# Patient Record
Sex: Male | Born: 1970 | Race: White | Hispanic: No | Marital: Single | Smoking: Former smoker
Health system: Southern US, Community
[De-identification: ages and names within clinical notes are randomized; demographics above are authoritative.]

## PROBLEM LIST (undated history)

## (undated) DIAGNOSIS — M109 Gout, unspecified: Secondary | ICD-10-CM

## (undated) DIAGNOSIS — A6002 Herpesviral infection of other male genital organs: Secondary | ICD-10-CM

## (undated) DIAGNOSIS — K219 Gastro-esophageal reflux disease without esophagitis: Secondary | ICD-10-CM

## (undated) HISTORY — PX: KNEE SURGERY: SHX244

## (undated) HISTORY — DX: Herpesviral infection of other male genital organs: A60.02

---

## 2000-12-06 ENCOUNTER — Ambulatory Visit (HOSPITAL_COMMUNITY): Admission: RE | Admit: 2000-12-06 | Discharge: 2000-12-06 | Payer: Self-pay | Admitting: Orthopedic Surgery

## 2000-12-06 ENCOUNTER — Encounter: Payer: Self-pay | Admitting: Orthopedic Surgery

## 2001-04-12 ENCOUNTER — Encounter: Payer: Self-pay | Admitting: Emergency Medicine

## 2001-04-12 ENCOUNTER — Emergency Department (HOSPITAL_COMMUNITY): Admission: EM | Admit: 2001-04-12 | Discharge: 2001-04-12 | Payer: Self-pay | Admitting: Emergency Medicine

## 2003-07-11 ENCOUNTER — Encounter: Payer: Self-pay | Admitting: Specialist

## 2003-07-11 ENCOUNTER — Ambulatory Visit (HOSPITAL_COMMUNITY): Admission: RE | Admit: 2003-07-11 | Discharge: 2003-07-11 | Payer: Self-pay | Admitting: Specialist

## 2012-03-19 ENCOUNTER — Emergency Department
Admission: EM | Admit: 2012-03-19 | Discharge: 2012-03-19 | Disposition: A | Discharge status: Home / Self Care | Payer: Self-pay | Source: Home / Self Care | Attending: Emergency Medicine | Admitting: Emergency Medicine

## 2012-03-19 ENCOUNTER — Telehealth: Payer: Self-pay | Admitting: *Deleted

## 2012-03-19 DIAGNOSIS — K649 Unspecified hemorrhoids: Secondary | ICD-10-CM

## 2012-03-19 HISTORY — DX: Gout, unspecified: M10.9

## 2012-03-19 MED ORDER — HYDROCORTISONE 2.5 % RE CREA: TOPICAL_CREAM | RECTAL | Status: AC

## 2012-03-19 MED ORDER — DOCUSATE SODIUM 100 MG PO CAPS: 100.0000 mg | ORAL_CAPSULE | Freq: Two times a day (BID) | ORAL | Status: AC

## 2012-03-19 NOTE — ED Provider Notes (Signed)
History     CSN: [id number]  Arrival date & time 03/19/12  1414   First MD Initiated Contact with Patient 03/19/12 1443      Chief Complaint  Patient presents with  . Hemorrhoids    2 days    (Consider location/radiation/quality/duration/timing/severity/associated sxs/prior treatment) HPI This patient comes today complaining of hemorrhoids.  He has a history of hemorrhoids ever since he was 41 years old.  He gets some about once a week or once a month.  He is briefly spoken to his PCP in the past but has never done anything about it.  He has used Preparation H which has helped in the past.  They have occasionally bled and are frequently painful as well.  He is coming today to see what he should do.  He has one that has flared up on him and it hurts when he goes to the bathroom or rides his motorcycle or sits in a chair.  Past Medical History  Diagnosis Date  . Gout     History reviewed. No pertinent past surgical history.  Family History  Problem Relation Age of Onset  . Heart failure Father     History  Substance Use Topics  . Smoking status: Current Some Day Smoker -- 20 years  . Smokeless tobacco: Never Used  . Alcohol Use: 10.0 oz/week    20 drink(s) per week      Review of Systems  All other systems reviewed and are negative.    Allergies  Review of patient's allergies indicates no known allergies.  Home Medications   Current Outpatient Rx  Name Route Sig Dispense Refill  . TRAMADOL HCL 50 MG PO TABS Oral Take 50 mg by mouth every 6 (six) hours as needed.    Marland Kitchen DOCUSATE SODIUM 100 MG PO CAPS Oral Take 1 capsule (100 mg total) by mouth every 12 (twelve) hours. 20 capsule 0  . HYDROCORTISONE 2.5 % RE CREA  Apply rectally 2 times daily 30 g 1    BP 131/83  Pulse 74  Temp 97.9 F (36.6 C) (Oral)  Resp 16  Ht 6' (1.829 m)  Wt 224 lb (101.606 kg)  BMI 30.38 kg/m2  SpO2 99%  Physical Exam  Nursing note and vitals reviewed. Constitutional: He is  oriented to person, place, and time. He appears well-developed and well-nourished.  HENT:  Head: Normocephalic and atraumatic.  Eyes: No scleral icterus.  Neck: Neck supple.  Cardiovascular: Regular rhythm and normal heart sounds.   Pulmonary/Chest: Effort normal and breath sounds normal. No respiratory distress.  Genitourinary:       Rectal examination demonstrates a external hemorrhoid approximately 2 cm in diameter.  It is tender to palpation.  It does not appear to be currently thrombosed.  No active bleeding.  No signs of infection or abscess.  Neurological: He is alert and oriented to person, place, and time.  Skin: Skin is warm and dry.  Psychiatric: He has a normal mood and affect. His speech is normal.    ED Course  Procedures (including critical care time)  Labs Reviewed - No data to display No results found.   1. Hemorrhoid       MDM  This patient is having recurrent hemorrhoids.  I gave him a prescription for docusate as well as Anusol HC cream to treat his current symptoms.  However because of his recurrent issues, I would like him to see a general surgeon and he agrees with that since he would  like to have it taken care of once and for all.  We will set him up with a general surgeon today in clinic.  A if worsening pain, he'll need to go the emergency room.  Avoid straining or sitting on toilet too long, increase hydration.  Marlaine Hind, MD 03/19/12 [phone]

## 2012-03-19 NOTE — ED Notes (Signed)
Roberto Porter complains of a hemorrhoid for 2 days. He states he has a history of hemorrhoids but the size of this one is larger than any he has had in the past.

## 2013-12-30 ENCOUNTER — Encounter: Payer: Self-pay | Admitting: Emergency Medicine

## 2013-12-30 ENCOUNTER — Emergency Department
Admission: EM | Admit: 2013-12-30 | Discharge: 2013-12-30 | Disposition: A | Discharge status: Home / Self Care | Payer: Managed Care, Other (non HMO) | Source: Home / Self Care | Attending: Family Medicine | Admitting: Family Medicine

## 2013-12-30 DIAGNOSIS — R079 Chest pain, unspecified: Secondary | ICD-10-CM

## 2013-12-30 DIAGNOSIS — R1013 Epigastric pain: Secondary | ICD-10-CM

## 2013-12-30 LAB — TROPONIN I: TROPONIN I: 0.01 ng/mL (ref <0.06)

## 2013-12-30 LAB — POCT CBC W AUTO DIFF (K'VILLE URGENT CARE)

## 2013-12-30 LAB — COMPLETE METABOLIC PANEL WITH GFR
ALT: 17 U/L (ref 0–53)
AST: 13 U/L (ref 0–37)
Albumin: 4.6 g/dL (ref 3.5–5.2)
Alkaline Phosphatase: 51 U/L (ref 39–117)
BILIRUBIN TOTAL: 1.1 mg/dL (ref 0.2–1.2)
BUN: 14 mg/dL (ref 6–23)
CO2: 24 mEq/L (ref 19–32)
Calcium: 11.7 mg/dL — ABNORMAL HIGH (ref 8.4–10.5)
Chloride: 98 mEq/L (ref 96–112)
Creat: 1.12 mg/dL (ref 0.50–1.35)
GFR, EST NON AFRICAN AMERICAN: 79 mL/min
GFR, Est African American: >89 mL/min
GLUCOSE: 110 mg/dL — AB (ref 70–99)
Potassium: 4.2 mEq/L (ref 3.5–5.3)
Sodium: 137 mEq/L (ref 135–145)
Total Protein: 7.4 g/dL (ref 6.0–8.3)

## 2013-12-30 LAB — LIPASE: LIPASE: 31 U/L (ref 0–75)

## 2013-12-30 LAB — AMYLASE: Amylase: 24 U/L (ref 0–105)

## 2013-12-30 MED ORDER — OMEPRAZOLE 40 MG PO CPDR: 40.0000 mg | DELAYED_RELEASE_CAPSULE | Freq: Every day | ORAL | Status: DC

## 2013-12-30 NOTE — ED Notes (Signed)
Nausea and abdominal pain since Sat. Stomach cramps can't get comfortable, unable to eat had chicken broth yesterday, sippig ginger ale and water only since 1pm on Sat. Vomited once.

## 2013-12-30 NOTE — Discharge Instructions (Signed)
May take Tums for 2 to 3 more days as needed. If symptoms become significantly worse during the night or over the weekend, proceed to the local emergency room.

## 2013-12-30 NOTE — ED Provider Notes (Signed)
CSN: [id number]     Arrival date & time 12/30/13  1029 History   First MD Initiated Contact with Patient 12/30/13 1051     Chief Complaint  Patient presents with  . Nausea      HPI Comments: Two days ago at about noon while at work patient developed a sensation of abdominal bloating, followed by intermittent "squeezing" mid-abdominal pain that radiates occasionally to his lower back.  Yesterday the discomfort began to last about two seconds, recurring every several seconds.  He states that belching helps.  The pain decreases for a while when he takes Tums.  No shortness of breath or sweating.  No palpitations.  No nausea/vomiting or change in bowel movements.  Last night he had sweats.  He has had decreased appetite for two days.  He admits that he drinks about 2 cases of beer per week.  He also smokes.  He admits that for about 10 years he occasionally has a sensation of food becoming "stuck" in his lower esophagus, relieved by liquids. Family history includes a fib in his father  Patient is a 43 y.o. male presenting with abdominal pain. The history is provided by the patient.  Abdominal Pain This is a new problem. The current episode started 2 days ago. The problem occurs constantly. The problem has not changed since onset.Associated symptoms include abdominal pain. Pertinent negatives include no chest pain and no shortness of breath. Nothing aggravates the symptoms. Relieved by: Tums. Treatments tried: Tums. The treatment provided moderate relief.    Past Medical History  Diagnosis Date  . Gout    History reviewed. No pertinent past surgical history. Family History  Problem Relation Age of Onset  . Heart failure Father    History  Substance Use Topics  . Smoking status: Current Some Day Smoker -- 25 years  . Smokeless tobacco: Never Used  . Alcohol Use: 10.0 oz/week    20 drink(s) per week    Review of Systems  Respiratory: Negative for shortness of breath.   Cardiovascular:  Negative for chest pain.  Gastrointestinal: Positive for abdominal pain.  All other systems reviewed and are negative.    Allergies  Review of patient's allergies indicates not on file.  Home Medications   Current Outpatient Rx  Name  Route  Sig  Dispense  Refill  . omeprazole (PRILOSEC) 40 MG capsule   Oral   Take 1 capsule (40 mg total) by mouth daily. Take 30 minutes before a meal.   30 capsule   1    BP 124/82  Pulse 58  Temp(Src) 97.6 F (36.4 C) (Oral)  Ht 6' (1.829 m)  Wt 206 lb (93.441 kg)  BMI 27.93 kg/m2  SpO2 98% Physical Exam Nursing notes and Vital Signs reviewed. Appearance:  Patient appears healthy, stated age, and in no acute distress Eyes:  Pupils are equal, round, and reactive to light and accomodation.  Extraocular movement is intact.  Conjunctivae are not inflamed  Ears:  Canals normal.  Tympanic membranes normal.  Nose:  Normal turbinates.  No sinus tenderness.   Pharynx:  Normal Neck:  Supple.  No adenopathy  Lungs:  Clear to auscultation.  Breath sounds are equal.  Heart:  Regular rate and rhythm without murmurs, rubs, or gallops.  Abdomen:  Minimal sub-xiphoid tenderness without masses or hepatosplenomegaly.  Bowel sounds are present.  No CVA or flank tenderness.  Extremities:  No edema.  No calf tenderness Skin:  No rash present.   ED Course  Procedures none    Labs Reviewed  COMPLETE METABOLIC PANEL WITH GFR  AMYLASE  LIPASE  TROPONIN I  TROPONIN T  POCT CBC W AUTO DIFF ([facility]):  WBC 8.4; LY 25.3; MO 7.4; GR 67.3; Hgb 16.5; Platelets 303   POCT URINALYSIS DIPSTICK (patient unable to obtain specimen)   EKG:  Rate 59 PR:  0.176 QT:  0.366 QRS:  0.94 Axis:  46 degrees Interpretation:  Sinus bradycardia; within normal limits     MDM   1. Chest pain:  Normal EKG reassuring  2. Abdominal pain, epigastric, suspect GI source: ?GERD, ?ulcer.  Normal white blood count reassuring   Pending:  CMP, amylase, lipase, Troponin  I, Troponin T Begin Prilosec. May take Tums for 2 to 3 more days as needed. If symptoms become significantly worse during the night or over the weekend, proceed to the local emergency room. Followup with GI in approximately one week.    Lattie HawStephen A Beese, MD 12/31/13 [phone]

## 2014-01-01 ENCOUNTER — Telehealth: Payer: Self-pay | Admitting: Emergency Medicine

## 2014-01-02 LAB — TROPONIN T: Troponin T TROPT: <0.01 ng/mL (ref <0.01)

## 2014-02-18 ENCOUNTER — Emergency Department
Admission: EM | Admit: 2014-02-18 | Discharge: 2014-02-18 | Disposition: A | Discharge status: Home / Self Care | Payer: Managed Care, Other (non HMO) | Source: Home / Self Care

## 2014-02-18 ENCOUNTER — Ambulatory Visit (INDEPENDENT_AMBULATORY_CARE_PROVIDER_SITE_OTHER): Payer: Managed Care, Other (non HMO) | Admitting: Sports Medicine

## 2014-02-18 ENCOUNTER — Emergency Department (INDEPENDENT_AMBULATORY_CARE_PROVIDER_SITE_OTHER): Payer: Managed Care, Other (non HMO)

## 2014-02-18 ENCOUNTER — Encounter: Payer: Self-pay | Admitting: Emergency Medicine

## 2014-02-18 DIAGNOSIS — IMO0002 Reserved for concepts with insufficient information to code with codable children: Secondary | ICD-10-CM

## 2014-02-18 DIAGNOSIS — S62610A Displaced fracture of proximal phalanx of right index finger, initial encounter for closed fracture: Secondary | ICD-10-CM

## 2014-02-18 HISTORY — DX: Gastro-esophageal reflux disease without esophagitis: K21.9

## 2014-02-18 MED ORDER — HYDROCODONE-ACETAMINOPHEN 5-325 MG PO TABS: 1.0000 | ORAL_TABLET | Freq: Three times a day (TID) | ORAL | Status: DC | PRN
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
## 2014-02-18 NOTE — Assessment & Plan Note (Signed)
Reduction of fracture in the office today. Radial gutter splint placed.  I do expect an additional 5-6 weeks for healing.  hydrocodone for pain. 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  Return in 2 weeks, x-ray before visit.  I billed a fracture code for this visit, all subsequent visits for this complaint will be "post-op checks" in the global period.
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
## 2014-02-19 NOTE — ED Provider Notes (Signed)
Agree with exam, assessment, and plan.   Lattie Haw, MD 02/19/14 1259

## 2014-03-03 ENCOUNTER — Encounter: Payer: Self-pay | Admitting: Sports Medicine

## 2014-03-03 ENCOUNTER — Emergency Department
Admission: EM | Admit: 2014-03-03 | Discharge: 2014-03-03 | Discharge status: Absent without leave | Payer: Managed Care, Other (non HMO) | Source: Home / Self Care

## 2014-03-03 ENCOUNTER — Ambulatory Visit (INDEPENDENT_AMBULATORY_CARE_PROVIDER_SITE_OTHER): Payer: Managed Care, Other (non HMO) | Admitting: Sports Medicine

## 2014-03-03 ENCOUNTER — Ambulatory Visit (INDEPENDENT_AMBULATORY_CARE_PROVIDER_SITE_OTHER): Payer: Managed Care, Other (non HMO)

## 2014-03-03 VITALS — BP 121/82 | HR 57 | Ht 72.0 in | Wt 218.0 lb

## 2014-03-03 DIAGNOSIS — IMO0002 Reserved for concepts with insufficient information to code with codable children: Secondary | ICD-10-CM

## 2014-03-03 DIAGNOSIS — S62610A Displaced fracture of proximal phalanx of right index finger, initial encounter for closed fracture: Secondary | ICD-10-CM

## 2014-03-03 DIAGNOSIS — IMO0001 Reserved for inherently not codable concepts without codable children: Secondary | ICD-10-CM

## 2014-03-03 NOTE — Assessment & Plan Note (Signed)
Overall doing well. I have placed an extension brace on his finger. He has questionable compliance with the splint I placed at the last visit. Unfortunately I am worried that he is developing early boutonniere deformity, I have braced the proximal interphalangeal joint and extension. Return to see me in 2 weeks.

## 2014-03-03 NOTE — Progress Notes (Signed)
  Subjective: 2 weeks postfracture at the base of the right index finger proximal phalanx, pain is doing much better, he is having some difficulty extending the proximal interphalangeal joint. He has had questionable compliance with his radial gutter splint.   Objective: General: Well-developed, well-nourished, and in no acute distress. Right hand: Still minimally tender to palpation over the fracture site, he does have what appears to be an early boutonniere's deformity at the proximal interphalangeal joint, he does have some strength to extension at the PIP, and good strength to extension of the DIP joint and there does not appear to be any rotational deformity.  Alignment remains anatomic, there is some bony callus formation.  I applied a dorsal extension splint keeping the proximal interphalangeal joint in full extension.  Assessment/plan:

## 2014-03-17 ENCOUNTER — Encounter: Payer: Self-pay | Admitting: Sports Medicine

## 2014-03-17 ENCOUNTER — Ambulatory Visit (INDEPENDENT_AMBULATORY_CARE_PROVIDER_SITE_OTHER): Payer: Managed Care, Other (non HMO)

## 2014-03-17 ENCOUNTER — Ambulatory Visit (INDEPENDENT_AMBULATORY_CARE_PROVIDER_SITE_OTHER): Payer: Managed Care, Other (non HMO) | Admitting: Sports Medicine

## 2014-03-17 VITALS — BP 128/74 | HR 63 | Ht 72.0 in | Wt 217.0 lb

## 2014-03-17 DIAGNOSIS — S5290XD Unspecified fracture of unspecified forearm, subsequent encounter for closed fracture with routine healing: Secondary | ICD-10-CM

## 2014-03-17 DIAGNOSIS — S62610A Displaced fracture of proximal phalanx of right index finger, initial encounter for closed fracture: Secondary | ICD-10-CM

## 2014-03-17 DIAGNOSIS — IMO0001 Reserved for inherently not codable concepts without codable children: Secondary | ICD-10-CM

## 2014-03-17 DIAGNOSIS — S62610D Displaced fracture of proximal phalanx of right index finger, subsequent encounter for fracture with routine healing: Secondary | ICD-10-CM

## 2014-03-17 MED ORDER — HYDROCODONE-ACETAMINOPHEN 5-325 MG PO TABS: 1.0000 | ORAL_TABLET | Freq: Three times a day (TID) | ORAL | Status: DC | PRN

## 2014-03-17 NOTE — Progress Notes (Signed)
  Subjective: Now approximately 5 weeks post fracture at the base of the proximal phalanx of the right index finger. Pain continues to improve, still with difficulty extending the finger fully.  Objective: General: Well-developed, well-nourished, and in no acute distress. Right hand: Only minimal tenderness to palpation of the fracture site and excellent range of motion at the second metacarpophalangeal joint. He still does have some flexion lag at the proximal interphalangeal joint of approximately 5, and very little strength to extension at the distal interphalangeal joint. Collaterals are stable.  Assessment/plan:

## 2014-03-17 NOTE — Assessment & Plan Note (Addendum)
Minimal compliance with immobilization, needs to go back to work. Again, I have voiced my concern that there is an early boutonniere's deformity, possibly also with mallet finger. Stack splint placed. Advised him to wear this as often as possible. Return in a month, if no better, and if we don't have good strength to extension then we will consider MRI and possible surgical intervention. I have also advised using a rubber band for finger extension exercises.
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
## 2017-04-06 DIAGNOSIS — G8929 Other chronic pain: Secondary | ICD-10-CM | POA: Insufficient documentation

## 2017-04-06 DIAGNOSIS — M1711 Unilateral primary osteoarthritis, right knee: Secondary | ICD-10-CM | POA: Insufficient documentation

## 2020-09-26 DIAGNOSIS — F1020 Alcohol dependence, uncomplicated: Secondary | ICD-10-CM | POA: Insufficient documentation

## 2023-10-02 ENCOUNTER — Encounter: Payer: Self-pay | Admitting: Family Medicine

## 2023-10-02 ENCOUNTER — Ambulatory Visit (INDEPENDENT_AMBULATORY_CARE_PROVIDER_SITE_OTHER): Payer: No Typology Code available for payment source | Admitting: Family Medicine

## 2023-10-02 VITALS — BP 130/82 | HR 61 | Ht 70.47 in | Wt 230.4 lb

## 2023-10-02 DIAGNOSIS — Z13220 Encounter for screening for lipoid disorders: Secondary | ICD-10-CM | POA: Diagnosis not present

## 2023-10-02 DIAGNOSIS — M25561 Pain in right knee: Secondary | ICD-10-CM

## 2023-10-02 DIAGNOSIS — E790 Hyperuricemia without signs of inflammatory arthritis and tophaceous disease: Secondary | ICD-10-CM

## 2023-10-02 DIAGNOSIS — E291 Testicular hypofunction: Secondary | ICD-10-CM | POA: Insufficient documentation

## 2023-10-02 DIAGNOSIS — F1020 Alcohol dependence, uncomplicated: Secondary | ICD-10-CM

## 2023-10-02 DIAGNOSIS — Z125 Encounter for screening for malignant neoplasm of prostate: Secondary | ICD-10-CM | POA: Diagnosis not present

## 2023-10-02 DIAGNOSIS — G8929 Other chronic pain: Secondary | ICD-10-CM

## 2023-10-02 NOTE — Progress Notes (Signed)
 Roberto Porter - 53 y.o. male MRN [id number]  Date of birth: 14-Sep-1971  Subjective Chief Complaint  Patient presents with   Establish Care   Anxiety   Hypogonadism    HPI Roberto Porter is a 53 y.o. male here today for initial visit to establish care.    He does report some increased stress related to his mother being in poor health as well as stress related to work.  He has history of hypogonadism.  He was doing injectable testosterone  previously and would like to restart this.  He felt pretty good using this and did not notice any adverse effects with this.   History of OA of the knee.  History of gout and previously taking allopurinol.  Seen by orthopedics previously.  He has had intra-articular injection previously with temporary improvement.  ROS:  A comprehensive ROS was completed and negative except as noted per HPI  No Known Allergies  Past Medical History:  Diagnosis Date   GERD (gastroesophageal reflux disease)    Gout    Herpes genitalis in men     Past Surgical History:  Procedure Laterality Date   KNEE SURGERY Right    ACL, meniscus, and replacement    Social History   Socioeconomic History   Marital status: Single    Spouse name: Not on file   Number of children: Not on file   Years of education: Not on file   Highest education level: Not on file  Occupational History   Occupation: Maintenance Tech  Tobacco Use   Smoking status: Former    Types: Cigarettes   Smokeless tobacco: Never  Vaping Use   Vaping status: Never Used  Substance and Sexual Activity   Alcohol use: Yes    Alcohol/week: 14.0 standard drinks of alcohol    Types: 14 Standard drinks or equivalent per week    Comment: 24 q wk   Drug use: No   Sexual activity: Yes  Other Topics Concern   Not on file  Social History Narrative   Not on file   Social Drivers of Health   Financial Resource Strain: Low Risk  (09/27/2020)   Received from [facility]   Overall Financial Resource  Strain (CARDIA)    Difficulty of Paying Living Expenses: Not hard at all  Food Insecurity: Not on file  Transportation Needs: Not on file  Physical Activity: Not on file  Stress: No Stress Concern Present (09/27/2020)   Received from [facility] - Occupational Stress Questionnaire    Feeling of Stress : Not at all  Social Connections: Unknown (02/06/2022)   [facility]   Social Network    Social Network: Not on file    Family History  Problem Relation Age of Onset   Heart failure Father     Health Maintenance  Topic Date Due   HIV Screening  Never done   Hepatitis C Screening  Never done   DTaP/Tdap/Td (1 - Tdap) Never done   Colonoscopy  Never done   Zoster Vaccines- Shingrix (1 of 2) Never done   INFLUENZA VACCINE  Never done   COVID-19 Vaccine (1 - 2024-25 season) Never done   HPV VACCINES  Aged Out     ----------------------------------------------------------------------------------------------------------------------------------------------------------------------------------------------------------------- Physical Exam BP 130/82 (BP Location: Left Arm, Patient Position: Sitting, Cuff Size: Large)   Pulse 61   Ht 5' 10.47 (1.79 m)   Wt 230 lb 6.4 oz (104.5 kg)  SpO2 96%   BMI 32.62 kg/m   Physical Exam Constitutional:      Appearance: Normal appearance.  HENT:     Head: Normocephalic and atraumatic.  Eyes:     General: No scleral icterus. Cardiovascular:     Rate and Rhythm: Normal rate and regular rhythm.  Pulmonary:     Effort: Pulmonary effort is normal.     Breath sounds: Normal breath sounds.  Musculoskeletal:     Cervical back: Neck supple.  Neurological:     Mental Status: He is alert.  Psychiatric:        Mood and Affect: Mood normal.        Behavior: Behavior normal.      ------------------------------------------------------------------------------------------------------------------------------------------------------------------------------------------------------------------- Assessment and Plan  EtOH dependence (HCC) We discussed cutting back on his alcohol consumption.  Chronic pain of right knee History of osteoarthritis.  Will plan to schedule with Dr. Curtis to discuss gel injections.  Hypogonadism in male Updating labs today including testosterone , FSH/LH and prolactin levels.   No orders of the defined types were placed in this encounter.   No follow-ups on file.    This visit occurred during the SARS-CoV-2 public health emergency.  Safety protocols were in place, including screening questions prior to the visit, additional usage of staff PPE, and extensive cleaning of exam room while observing appropriate contact time as indicated for disinfecting solutions.

## 2023-10-02 NOTE — Assessment & Plan Note (Signed)
 History of osteoarthritis.  Will plan to schedule with Dr. Benjamin Stain to discuss gel injections.

## 2023-10-02 NOTE — Assessment & Plan Note (Signed)
 We discussed cutting back on his alcohol consumption.

## 2023-10-02 NOTE — Assessment & Plan Note (Signed)
 Updating labs today including testosterone, FSH/LH and prolactin levels.

## 2023-10-03 ENCOUNTER — Other Ambulatory Visit (INDEPENDENT_AMBULATORY_CARE_PROVIDER_SITE_OTHER): Payer: No Typology Code available for payment source

## 2023-10-03 ENCOUNTER — Ambulatory Visit: Payer: No Typology Code available for payment source

## 2023-10-03 ENCOUNTER — Encounter: Payer: Self-pay | Admitting: Sports Medicine

## 2023-10-03 ENCOUNTER — Ambulatory Visit (INDEPENDENT_AMBULATORY_CARE_PROVIDER_SITE_OTHER): Payer: No Typology Code available for payment source | Admitting: Sports Medicine

## 2023-10-03 DIAGNOSIS — M25561 Pain in right knee: Secondary | ICD-10-CM | POA: Diagnosis not present

## 2023-10-03 DIAGNOSIS — G8929 Other chronic pain: Secondary | ICD-10-CM

## 2023-10-03 DIAGNOSIS — Z0189 Encounter for other specified special examinations: Secondary | ICD-10-CM

## 2023-10-03 LAB — CBC WITH DIFFERENTIAL/PLATELET
Basophils Absolute: 0.1 10*3/uL (ref 0.0–0.2)
Basos: 1 %
EOS (ABSOLUTE): 0.3 10*3/uL (ref 0.0–0.4)
Eos: 6 %
Hematocrit: 43.5 % (ref 37.5–51.0)
Hemoglobin: 15.2 g/dL (ref 13.0–17.7)
Immature Grans (Abs): 0 10*3/uL (ref 0.0–0.1)
Immature Granulocytes: 0 %
Lymphocytes Absolute: 1.9 10*3/uL (ref 0.7–3.1)
Lymphs: 32 %
MCH: 33.2 pg — ABNORMAL HIGH (ref 26.6–33.0)
MCHC: 34.9 g/dL (ref 31.5–35.7)
MCV: 95 fL (ref 79–97)
Monocytes Absolute: 0.5 10*3/uL (ref 0.1–0.9)
Monocytes: 8 %
Neutrophils Absolute: 3.2 10*3/uL (ref 1.4–7.0)
Neutrophils: 53 %
Platelets: 274 10*3/uL (ref 150–450)
RBC: 4.58 x10E6/uL (ref 4.14–5.80)
RDW: 12.5 % (ref 11.6–15.4)
WBC: 6 10*3/uL (ref 3.4–10.8)

## 2023-10-03 LAB — FSH/LH
FSH: 16.7 m[IU]/mL — ABNORMAL HIGH (ref 1.5–12.4)
LH: 9.5 m[IU]/mL — ABNORMAL HIGH (ref 1.7–8.6)

## 2023-10-03 LAB — CMP14+EGFR
ALT: 24 [IU]/L (ref 0–44)
AST: 23 [IU]/L (ref 0–40)
Albumin: 4.4 g/dL (ref 3.8–4.9)
Alkaline Phosphatase: 62 [IU]/L (ref 44–121)
BUN/Creatinine Ratio: 15 (ref 9–20)
BUN: 16 mg/dL (ref 6–24)
Bilirubin Total: 0.5 mg/dL (ref 0.0–1.2)
CO2: 21 mmol/L (ref 20–29)
Calcium: 10 mg/dL (ref 8.7–10.2)
Chloride: 102 mmol/L (ref 96–106)
Creatinine, Ser: 1.09 mg/dL (ref 0.76–1.27)
Globulin, Total: 2.4 g/dL (ref 1.5–4.5)
Glucose: 96 mg/dL (ref 70–99)
Potassium: 4.4 mmol/L (ref 3.5–5.2)
Sodium: 138 mmol/L (ref 134–144)
Total Protein: 6.8 g/dL (ref 6.0–8.5)
eGFR: 81 mL/min/{1.73_m2} (ref >59)

## 2023-10-03 LAB — LIPID PANEL WITH LDL/HDL RATIO
Cholesterol, Total: 244 mg/dL — ABNORMAL HIGH (ref 100–199)
HDL: 53 mg/dL (ref >39)
LDL Chol Calc (NIH): 152 mg/dL — ABNORMAL HIGH (ref 0–99)
LDL/HDL Ratio: 2.9 {ratio} (ref 0.0–3.6)
Triglycerides: 213 mg/dL — ABNORMAL HIGH (ref 0–149)
VLDL Cholesterol Cal: 39 mg/dL (ref 5–40)

## 2023-10-03 LAB — PSA: Prostate Specific Ag, Serum: 0.7 ng/mL (ref 0.0–4.0)

## 2023-10-03 LAB — PROLACTIN: Prolactin: 10 ng/mL (ref 3.6–25.2)

## 2023-10-03 LAB — URIC ACID: Uric Acid: 7.4 mg/dL (ref 3.8–8.4)

## 2023-10-03 LAB — TESTOSTERONE: Testosterone: 156 ng/dL — ABNORMAL LOW (ref 264–916)

## 2023-10-03 MED ORDER — TESTOSTERONE CYPIONATE 200 MG/ML IM SOLN: 200.0000 mg | INTRAMUSCULAR | 1 refills | Status: DC

## 2023-10-03 NOTE — Assessment & Plan Note (Signed)
 Pleasant 53 year old male, history of ACL reconstruction, known osteoarthritis. Increasing pain and swelling right knee, Voltaren not effective. We discussed the treatment options including injection of steroid, PRP, Visco. We will do an aspiration and injection today, return to see me in 6 weeks.

## 2023-10-03 NOTE — Progress Notes (Signed)
    Procedures performed today:    Procedure: Real-time Ultrasound Guided aspiration/injection of right knee Device: Samsung HS60  Verbal informed consent obtained.  Time-out conducted.  Noted no overlying erythema, induration, or other signs of local infection.  Skin prepped in a sterile fashion.  Local anesthesia: Topical Ethyl chloride.  With sterile technique and under real time ultrasound guidance: Aspirated 30 mL of clear, straw-colored fluid, syringe switched and 1 cc Kenalog 40, 2 cc lidocaine, 2 cc bupivacaine injected easily Completed without difficulty  Advised to call if fevers/chills, erythema, induration, drainage, or persistent bleeding.  Images permanently stored and available for review in PACS.  Impression: Technically successful ultrasound guided aspiration/injection.  Independent interpretation of notes and tests performed by another provider:   None.  Brief History, Exam, Impression, and Recommendations:    Chronic pain of right knee Pleasant 53 year old male, history of ACL reconstruction, known osteoarthritis. Increasing pain and swelling right knee, Voltaren not effective. We discussed the treatment options including injection of steroid, PRP, Visco. We will do an aspiration and injection today, return to see me in 6 weeks.  I spent 40 minutes of total time managing this patient today, this includes chart review, face to face, and non-face to face time.  This was separate from the time spent performing the procedure.  ____________________________________________ Debby PARAS. Curtis, M.D., ABFM., CAQSM., AME. Primary Care and Sports Medicine [place] [facility]  Adjunct Professor of [facility] Medicine  [employer]  Restaurant Manager, [employer]

## 2023-11-14 ENCOUNTER — Ambulatory Visit (INDEPENDENT_AMBULATORY_CARE_PROVIDER_SITE_OTHER): Payer: No Typology Code available for payment source | Admitting: Sports Medicine

## 2023-11-14 ENCOUNTER — Telehealth: Payer: Self-pay | Admitting: Sports Medicine

## 2023-11-14 ENCOUNTER — Encounter: Payer: Self-pay | Admitting: Sports Medicine

## 2023-11-14 DIAGNOSIS — M1711 Unilateral primary osteoarthritis, right knee: Secondary | ICD-10-CM

## 2023-11-14 NOTE — Telephone Encounter (Signed)
Right knee x-ray confirmed osteoarthritis, failed over 6 weeks of therapy, steroid injections, medication, needs Orthovisc or any other Visco supplement approval please.

## 2023-11-14 NOTE — Progress Notes (Signed)
    Procedures performed today:    None.  Independent interpretation of notes and tests performed by another provider:   None.  Brief History, Exam, Impression, and Recommendations:    Primary osteoarthritis of right knee Pleasant 53 year old male, chronic right knee pain, we did an aspiration and injection at the last visit and he got excellent relief but only for short time. X-rays did show severe osteoarthritis. At this point he has failed greater than 6 weeks conservative treatment, we will proceed with Visco approval. We also discussed PRP, geniculate artery embolization. Declines pain medication for now. Return to see me to start gel shots when approved.  I spent 30 minutes of total time managing this patient today, this includes chart review, face to face, and non-face to face time.  ____________________________________________ Ihor Austin. Benjamin Stain, M.D., ABFM., CAQSM., AME. Primary Care and Sports Medicine [place] [facility]  Adjunct Professor of Family Medicine  [place] [employer] [facility] [employer]  Restaurant manager, fast food

## 2023-11-14 NOTE — Assessment & Plan Note (Addendum)
Pleasant 53 year old male, chronic right knee pain, we did an aspiration and injection at the last visit and he got excellent relief but only for short time. X-rays did show severe osteoarthritis. At this point he has failed greater than 6 weeks conservative treatment, we will proceed with Visco approval. We also discussed PRP, geniculate artery embolization. Declines pain medication for now. Return to see me to start gel shots when approved.

## 2023-11-16 ENCOUNTER — Telehealth: Payer: Self-pay

## 2023-12-05 NOTE — Telephone Encounter (Signed)
 Copied from CRM [id number]. Topic: General - Other >> Dec 04, 2023  5:03 PM [place] B wrote: Reason for CRM: pt called in about status if Dr Benjamin Stain, called his insurance to see if the knee injections would covered. Please cb

## 2023-12-06 NOTE — Telephone Encounter (Addendum)
 Benefits Investigation Details received from [facility] Injection: Orthovisc PA required: Yes May fill through: Buy and Bill (preferred) OR Specialty Pharmacy OV Copay/Coinsurance: 10 % Product Copay: 10 % Administration Coinsurance: 10 % Administration Copay:  Deductible: $ 2400 (Met: $1058.59)  Additional Notes/ Clinic Action Steps: Specialty pharmacy fulfillment available through medical benefits. Deductible applies. Once deductible has been met, pt is responsible for a coinsurance. Once the OOP has been met, the pt is covered at 100% Prior Authorization is required through [facility], submit online to [email or web address]  Pharmacy: due to 3rd party restrictions with [facility] we are unable to obtain coverage information.  Prior Authorization has been started.

## 2023-12-06 NOTE — Telephone Encounter (Deleted)
 Benefits Investigation Details received from [facility] Injection: Orthovisc PA required: Yes May fill through: Buy and Bill OR Specialty Pharmacy OV Copay/Coinsurance: 10% Product Copay: 10% Administration Coinsurance: 10% Administration Copay:  Deductible: $ 2400 ( 1058 met )

## 2023-12-13 ENCOUNTER — Telehealth: Payer: Self-pay | Admitting: Family Medicine

## 2023-12-13 NOTE — Telephone Encounter (Signed)
 Copied from CRM [id number]. Topic: General - Billing Inquiry >> Dec 13, 2023 12:59 PM Marland Kitchen D wrote:  Patient wants to know why he keeps getting X-ray bills and hwy was his visit for the office $210 he said the doctor didn't do anything and he was only there for  Can you check into this please and let the patient know what can be done Thank you

## 2023-12-13 NOTE — Telephone Encounter (Signed)
 Contacted patient regarding billing concerns for DOS 11/14/23, $212.56. Explained to patient that initial charges were $260.00, $47.48 discount applied leaving a balance of $212.52. [employer] applied the balance towards his deductible. Patient verbalized understanding but was still unhappy about the balance.   Pt also inquired about Orthovisc injections and the cost of them. Advised patient that he will hear back from the office as soon as we have obtained information from his insurance company.

## 2023-12-14 ENCOUNTER — Telehealth

## 2023-12-14 MED ORDER — EUFLEXXA 20 MG/2ML IX SOSY: 1.0000 | PREFILLED_SYRINGE | INTRA_ARTICULAR | 1 refills | Status: AC

## 2023-12-14 NOTE — Telephone Encounter (Signed)
 Orthovisc concerns are address in a different telephone encounter. This request has been resolved

## 2023-12-14 NOTE — Telephone Encounter (Signed)
 Benefits Investigation Details received from [facility] Injection: Euflexxa  PA required: No May fill through: Buy and Bill OR Specialty Pharmacy (preferred) OV Copay/Coinsurance: 10% Product Copay: 10% $235.33 Administration Coinsurance:10 % Administration Copay:  Deductible: $ 2400 (Met: $1058.59)   A prior auth was submitted for the pt for Orthovisc supplementation, product is only covered if the pt has tried and failed  Euflexxa or Durolane.  No pa is is required for euflexxa however speciality pharmacy is preferred  Pt has been called and notified of co-pay of $235.33, pt is agreeable to amount.  Awaiting medications, from speciality will call and schedule pt for first round of injections once received.  If in the near future the pt would like to get orthovisc pt's co-pay would be 179.67

## 2023-12-14 NOTE — Addendum Note (Signed)
 Addended by: Ernest Mallick A on: 12/14/2023 05:18 PM   Modules accepted: Orders

## 2023-12-14 NOTE — Addendum Note (Signed)
 Addended by: Monica Becton on: 12/14/2023 05:25 PM   Modules accepted: Orders

## 2023-12-14 NOTE — Telephone Encounter (Signed)
 This request has been completed pt has been called in regards to visco supplementation

## 2023-12-14 NOTE — Telephone Encounter (Signed)
 This request has been resolved, pt has been called in regards to visco supplementation, documentation is in a separate telephone encounter

## 2023-12-14 NOTE — Telephone Encounter (Signed)
 Copied from CRM [id number]. Topic: Clinical - Medication Question >> Dec 14, 2023 12:23 PM Nila Nephew wrote: Reason for CRM: Patient is calling to ask about orthovisc injections. Patient is requesting that this be rush-sent as he doe not have much longer he is willing to wait.  Patient requesting a call back.

## 2023-12-14 NOTE — Telephone Encounter (Signed)
 Thank you, Euflexxa signed off on and sent.

## 2023-12-20 NOTE — Telephone Encounter (Signed)
 Please order one box of Euflexxa.

## 2024-01-08 ENCOUNTER — Ambulatory Visit (INDEPENDENT_AMBULATORY_CARE_PROVIDER_SITE_OTHER): Admitting: Sports Medicine

## 2024-01-08 ENCOUNTER — Other Ambulatory Visit (INDEPENDENT_AMBULATORY_CARE_PROVIDER_SITE_OTHER)

## 2024-01-08 DIAGNOSIS — M1711 Unilateral primary osteoarthritis, right knee: Secondary | ICD-10-CM

## 2024-01-08 DIAGNOSIS — E291 Testicular hypofunction: Secondary | ICD-10-CM | POA: Diagnosis not present

## 2024-01-08 MED ORDER — TESTOSTERONE CYPIONATE 200 MG/ML IM SOLN: 200.0000 mg | INTRAMUSCULAR | 1 refills | Status: DC

## 2024-01-08 NOTE — Assessment & Plan Note (Signed)
 Known severe osteoarthritis, Euflexxa No. 1 of 3 right knee, return in 1 week for #2 of 3. Declines pain medication. We have discussed PRP and geniculate artery embolization.

## 2024-01-08 NOTE — Addendum Note (Signed)
 Addended by: OLIVA-AVELLANEDA, Dainelle Hun L on: 01/08/2024 09:32 AM   Modules accepted: Orders

## 2024-01-08 NOTE — Assessment & Plan Note (Signed)
 Doing much better since restarting testosterone, I will refill this, he can follow this up with Dr. Augustus Ledger per He does need to have his CBC, PSA, testosterone levels rechecked 1 week after his next injection, that will be next Wednesday.

## 2024-01-08 NOTE — Progress Notes (Signed)
    Procedures performed today:    Procedure: Real-time Ultrasound Guided injection of the right knee Device: Samsung HS60  Verbal informed consent obtained.  Time-out conducted.  Noted no overlying erythema, induration, or other signs of local infection.  Skin prepped in a sterile fashion.  Local anesthesia: Topical Ethyl chloride.  With sterile technique and under real time ultrasound guidance: Noted trace effusion, Euflexxa injected easily. Completed without difficulty  Advised to call if fevers/chills, erythema, induration, drainage, or persistent bleeding.  Images permanently stored and available for review in PACS.  Impression: Technically successful ultrasound guided injection.  Independent interpretation of notes and tests performed by another provider:   None.  Brief History, Exam, Impression, and Recommendations:    Primary osteoarthritis of right knee Known severe osteoarthritis, Euflexxa No. 1 of 3 right knee, return in 1 week for #2 of 3. Declines pain medication. We have discussed PRP and geniculate artery embolization.  Hypogonadism in male Doing much better since restarting testosterone, I will refill this, he can follow this up with Dr. Augustus Ledger per He does need to have his CBC, PSA, testosterone levels rechecked 1 week after his next injection, that will be next Wednesday.    ____________________________________________ Joselyn Nicely. Sandy Crumb, M.D., ABFM., CAQSM., AME. Primary Care and Sports Medicine [place] [facility]  Adjunct Professor of [facility] Medicine  [employer]  Restaurant manager, fast food

## 2024-01-17 ENCOUNTER — Other Ambulatory Visit (INDEPENDENT_AMBULATORY_CARE_PROVIDER_SITE_OTHER)

## 2024-01-17 ENCOUNTER — Ambulatory Visit (INDEPENDENT_AMBULATORY_CARE_PROVIDER_SITE_OTHER): Admitting: Sports Medicine

## 2024-01-17 DIAGNOSIS — E291 Testicular hypofunction: Secondary | ICD-10-CM

## 2024-01-17 DIAGNOSIS — M1711 Unilateral primary osteoarthritis, right knee: Secondary | ICD-10-CM

## 2024-01-17 NOTE — Progress Notes (Signed)
    Procedures performed today:    Procedure: Real-time Ultrasound Guided injection of the right knee Device: Samsung HS60  Verbal informed consent obtained.  Time-out conducted.  Noted no overlying erythema, induration, or other signs of local infection.  Skin prepped in a sterile fashion.  Local anesthesia: Topical Ethyl chloride.  With sterile technique and under real time ultrasound guidance: Noted trace effusion, Euflexxa injected easily. Completed without difficulty  Advised to call if fevers/chills, erythema, induration, drainage, or persistent bleeding.  Images permanently stored and available for review in PACS.  Impression: Technically successful ultrasound guided injection.  Independent interpretation of notes and tests performed by another provider:   None.  Brief History, Exam, Impression, and Recommendations:    Primary osteoarthritis of right knee Severe osteoarthritis, Euflexxa 2 of 3 right knee, return in 1 week for #3 of 3.  Hypogonadism in male Feels much better on testosterone , rechecking levels today including CBC, PSA.    ____________________________________________ Joselyn Nicely. Sandy Crumb, M.D., ABFM., CAQSM., AME. Primary Care and Sports Medicine [place] [facility]  Adjunct Professor of [facility] Medicine  [employer]  Restaurant manager, fast food

## 2024-01-17 NOTE — Assessment & Plan Note (Signed)
 Severe osteoarthritis, Euflexxa 2 of 3 right knee, return in 1 week for #3 of 3.

## 2024-01-17 NOTE — Assessment & Plan Note (Signed)
 Feels much better on testosterone , rechecking levels today including CBC, PSA.

## 2024-01-18 LAB — CBC
Hematocrit: 47 % (ref 37.5–51.0)
Hemoglobin: 16.4 g/dL (ref 13.0–17.7)
MCH: 32.6 pg (ref 26.6–33.0)
MCHC: 34.9 g/dL (ref 31.5–35.7)
MCV: 93 fL (ref 79–97)
Platelets: 267 10*3/uL (ref 150–450)
RBC: 5.03 x10E6/uL (ref 4.14–5.80)
RDW: 12.1 % (ref 11.6–15.4)
WBC: 5.8 10*3/uL (ref 3.4–10.8)

## 2024-01-18 LAB — COMPREHENSIVE METABOLIC PANEL WITH GFR
ALT: 18 IU/L (ref 0–44)
AST: 15 IU/L (ref 0–40)
Albumin: 4.3 g/dL (ref 3.8–4.9)
Alkaline Phosphatase: 64 IU/L (ref 44–121)
BUN/Creatinine Ratio: 14 (ref 9–20)
BUN: 14 mg/dL (ref 6–24)
Bilirubin Total: 0.6 mg/dL (ref 0.0–1.2)
CO2: 20 mmol/L (ref 20–29)
Calcium: 9.5 mg/dL (ref 8.7–10.2)
Chloride: 104 mmol/L (ref 96–106)
Creatinine, Ser: 1 mg/dL (ref 0.76–1.27)
Globulin, Total: 2.6 g/dL (ref 1.5–4.5)
Glucose: 99 mg/dL (ref 70–99)
Potassium: 4.5 mmol/L (ref 3.5–5.2)
Sodium: 138 mmol/L (ref 134–144)
Total Protein: 6.9 g/dL (ref 6.0–8.5)
eGFR: 89 mL/min/{1.73_m2} (ref >59)

## 2024-01-18 LAB — TESTOSTERONE, FREE, TOTAL, SHBG
Sex Hormone Binding: 12.8 nmol/L — ABNORMAL LOW (ref 19.3–76.4)
Testosterone, Free: 16 pg/mL (ref 7.2–24.0)
Testosterone: 511 ng/dL (ref 264–916)

## 2024-01-18 LAB — PSA, TOTAL AND FREE
PSA, Free Pct: 23.3 %
PSA, Free: 0.28 ng/mL
Prostate Specific Ag, Serum: 1.2 ng/mL (ref 0.0–4.0)

## 2024-01-24 ENCOUNTER — Other Ambulatory Visit (INDEPENDENT_AMBULATORY_CARE_PROVIDER_SITE_OTHER)

## 2024-01-24 ENCOUNTER — Ambulatory Visit (INDEPENDENT_AMBULATORY_CARE_PROVIDER_SITE_OTHER): Admitting: Sports Medicine

## 2024-01-24 DIAGNOSIS — M1711 Unilateral primary osteoarthritis, right knee: Secondary | ICD-10-CM | POA: Diagnosis not present

## 2024-01-24 NOTE — Progress Notes (Signed)
    Procedures performed today:    Procedure: Real-time Ultrasound Guided injection of the right knee Device: Samsung HS60  Verbal informed consent obtained.  Time-out conducted.  Noted no overlying erythema, induration, or other signs of local infection.  Skin prepped in a sterile fashion.  Local anesthesia: Topical Ethyl chloride.  With sterile technique and under real time ultrasound guidance: Noted moderate effusion, Euflexxa injected easily. Completed without difficulty  Advised to call if fevers/chills, erythema, induration, drainage, or persistent bleeding.  Images permanently stored and available for review in PACS.  Impression: Technically successful ultrasound guided injection.  Independent interpretation of notes and tests performed by another provider:   None.  Brief History, Exam, Impression, and Recommendations:    Primary osteoarthritis of right knee Euflexxa No. 3 of 3 right knee    ____________________________________________ Joselyn Nicely. Sandy Crumb, M.D., ABFM., CAQSM., AME. Primary Care and Sports Medicine [place] [facility]  Adjunct Professor of [facility] Medicine  [employer]  Restaurant manager, fast food

## 2024-01-24 NOTE — Assessment & Plan Note (Signed)
 Euflexxa No. 3 of 3 right knee

## 2024-01-24 NOTE — Addendum Note (Signed)
 Addended by: Montgomery Apgar on: 01/24/2024 10:52 AM   Modules accepted: Orders

## 2024-05-28 ENCOUNTER — Encounter: Payer: Self-pay | Admitting: Sports Medicine

## 2024-07-11 ENCOUNTER — Other Ambulatory Visit: Payer: Self-pay

## 2024-07-11 DIAGNOSIS — E291 Testicular hypofunction: Secondary | ICD-10-CM

## 2024-07-12 ENCOUNTER — Other Ambulatory Visit: Payer: Self-pay | Admitting: Family Medicine

## 2024-07-12 DIAGNOSIS — E291 Testicular hypofunction: Secondary | ICD-10-CM

## 2024-07-12 MED ORDER — TESTOSTERONE CYPIONATE 200 MG/ML IM SOLN: 200.0000 mg | INTRAMUSCULAR | 1 refills | Status: AC

## 2024-10-04 ENCOUNTER — Ambulatory Visit
Admission: EM | Admit: 2024-10-04 | Discharge: 2024-10-04 | Disposition: A | Discharge status: Home / Self Care | Attending: Family Medicine | Admitting: Family Medicine

## 2024-10-04 DIAGNOSIS — G510 Bell's palsy: Secondary | ICD-10-CM | POA: Diagnosis not present

## 2024-10-04 DIAGNOSIS — J0100 Acute maxillary sinusitis, unspecified: Secondary | ICD-10-CM

## 2024-10-04 MED ORDER — PREDNISONE 20 MG PO TABS: 60.0000 mg | ORAL_TABLET | Freq: Every day | ORAL | 0 refills | Status: AC

## 2024-10-04 MED ORDER — AMOXICILLIN-POT CLAVULANATE 875-125 MG PO TABS: 1.0000 | ORAL_TABLET | Freq: Two times a day (BID) | ORAL | 0 refills | Status: AC

## 2024-10-04 NOTE — ED Triage Notes (Signed)
 Pt states he noticed facial numbness on his left side x 6 days. No other symptoms at this time.

## 2024-10-04 NOTE — Discharge Instructions (Addendum)
 You were diagnosed with a Bells Palsy and sinus infection today.  I have sent out several mediations to take for the next week.  Please get rest and fluids.  You may continue over the counter medications for sinus issues.  Please follow up with your primary care provider as needed.

## 2024-10-04 NOTE — ED Provider Notes (Signed)
 " [facility]    CSN: [id number] Arrival date & time: 10/04/24  0813      History   Chief Complaint Chief Complaint  Patient presents with   Facial Droop    HPI Roberto Porter is a 54 y.o. male.   Patient is here for left sided facial numbness and droop for 6 days.  Mostly notes the numbness and a friend at work noted his face was asymmetric. No other neuro symptoms.  He does have sinus congestion, drainage, pressure for over a week as well.  Using otc medications with some help.        Past Medical History:  Diagnosis Date   GERD (gastroesophageal reflux disease)    Gout    Herpes genitalis in men     Patient Active Problem List   Diagnosis Date Noted   Hypogonadism in male 10/02/2023   EtOH dependence (HCC) 09/26/2020   Primary osteoarthritis of right knee 04/06/2017   Closed displaced fracture of proximal phalanx of right index finger 02/18/2014    Past Surgical History:  Procedure Laterality Date   KNEE SURGERY Right    ACL, meniscus, and replacement       Home Medications    Prior to Admission medications  Medication Sig Start Date End Date Taking? Authorizing Provider  testosterone  cypionate (DEPOTESTOSTERONE CYPIONATE) 200 MG/ML injection Inject 1 mL (200 mg total) into the muscle every 14 (fourteen) days. 07/12/24  Yes Alvia Bring, DO  diclofenac (VOLTAREN) 75 MG EC tablet Take 75 mg by mouth 2 (two) times daily.    [provider]  Sodium Hyaluronate, Viscosup, (EUFLEXXA) 20 MG/2ML SOSY Inject 20 mg into the articular space once a week. x3 weeks.  Dx Knee osteoarthritis, patient will bring syringes to me and I will inject 12/14/23   Curtis Debby PARAS, MD    Family History Family History  Problem Relation Age of Onset   Heart failure Father     Social History Social History[1]   Allergies   Patient has no known allergies.   Review of Systems Review of Systems  Constitutional: Negative.   HENT:  Positive for  congestion, sinus pressure and sinus pain.   Respiratory: Negative.    Cardiovascular: Negative.   Gastrointestinal: Negative.   Neurological:  Positive for facial asymmetry and numbness.     Physical Exam Triage Vital Signs ED Triage Vitals  Encounter Vitals Group     BP 10/04/24 0822 129/76     Girls Systolic BP Percentile --      Girls Diastolic BP Percentile --      Boys Systolic BP Percentile --      Boys Diastolic BP Percentile --      Pulse Rate 10/04/24 0822 61     Resp 10/04/24 0822 16     Temp 10/04/24 0822 (!) 97.5 F (36.4 C)     Temp Source 10/04/24 0822 Oral     SpO2 10/04/24 0822 97 %     Weight --      Height --      Head Circumference --      Peak Flow --      Pain Score 10/04/24 0827 0     Pain Loc --      Pain Education --      Exclude from Growth Chart --    No data found.  Updated Vital Signs BP 129/76 (BP Location: Right Arm)   Pulse 61   Temp (!) 97.5 F (  36.4 C) (Oral)   Resp 16   SpO2 97%   Visual Acuity Right Eye Distance:   Left Eye Distance:   Bilateral Distance:    Right Eye Near:   Left Eye Near:    Bilateral Near:     Physical Exam Constitutional:      Appearance: Normal appearance. He is normal weight.  HENT:     Right Ear: Tympanic membrane normal.     Left Ear: A middle ear effusion is present.     Mouth/Throat:     Mouth: Mucous membranes are moist.  Cardiovascular:     Rate and Rhythm: Normal rate and regular rhythm.  Pulmonary:     Effort: Pulmonary effort is normal.     Breath sounds: Normal breath sounds.  Neurological:     Mental Status: He is alert and oriented to person, place, and time.     Comments: Normal sensation to the face bilaterally.  Unable to bring up the corner of the mouth on the left;  unable to blow cheek on the left;  tongue is midline  Psychiatric:        Mood and Affect: Mood normal.        Behavior: Behavior normal.      UC Treatments / Results  Labs (all labs ordered are listed,  but only abnormal results are displayed) Labs Reviewed - No data to display  EKG   Radiology No results found.  Procedures Procedures (including critical care time)  Medications Ordered in UC Medications - No data to display  Initial Impression / Assessment and Plan / UC Course  I have reviewed the triage vital signs and the nursing notes.  Pertinent labs & imaging results that were available during my care of the patient were reviewed by me and considered in my medical decision making (see chart for details).   Final Clinical Impressions(s) / UC Diagnoses   Final diagnoses:  Bell's palsy  Acute non-recurrent maxillary sinusitis     Discharge Instructions      You were diagnosed with a Bells Palsy and sinus infection today.  I have sent out several mediations to take for the next week.  Please get rest and fluids.  You may continue over the counter medications for sinus issues.  Please follow up with your primary care provider as needed.     ED Prescriptions     Medication Sig Dispense Auth. Provider   predniSONE  (DELTASONE ) 20 MG tablet Take 3 tablets (60 mg total) by mouth daily for 7 days. 21 tablet Alixandria Friedt, MD   amoxicillin -clavulanate (AUGMENTIN ) 875-125 MG tablet Take 1 tablet by mouth every 12 (twelve) hours for 7 days. 14 tablet Darral Longs, MD      PDMP not reviewed this encounter.    [1]  Social History Tobacco Use   Smoking status: Former    Types: Cigarettes   Smokeless tobacco: Never  Vaping Use   Vaping status: Never Used  Substance Use Topics   Alcohol use: Yes    Alcohol/week: 14.0 standard drinks of alcohol    Types: 14 Standard drinks or equivalent per week    Comment: 24 q wk   Drug use: Yes    Types: Marijuana     Darral Longs, MD 10/04/24 [phone]  "
